# Patient Record
Sex: Female | Born: 2000 | Race: White | Hispanic: No | Marital: Single | Smoking: Never smoker
Health system: Southern US, Community
[De-identification: ages and names within clinical notes are randomized; demographics above are authoritative.]

## PROBLEM LIST (undated history)

## (undated) DIAGNOSIS — F32A Depression, unspecified: Secondary | ICD-10-CM

## (undated) DIAGNOSIS — J45909 Unspecified asthma, uncomplicated: Secondary | ICD-10-CM

## (undated) DIAGNOSIS — F419 Anxiety disorder, unspecified: Secondary | ICD-10-CM

## (undated) HISTORY — DX: Anxiety disorder, unspecified: F41.9

## (undated) HISTORY — DX: Depression, unspecified: F32.A

## (undated) HISTORY — DX: Unspecified asthma, uncomplicated: J45.909

---
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
## 2021-11-06 ENCOUNTER — Encounter: Payer: Self-pay | Admitting: Nurse Practitioner

## 2021-11-06 ENCOUNTER — Other Ambulatory Visit: Payer: Self-pay

## 2021-11-06 ENCOUNTER — Ambulatory Visit (INDEPENDENT_AMBULATORY_CARE_PROVIDER_SITE_OTHER): Payer: Medicaid Other | Admitting: Nurse Practitioner

## 2021-11-06 VITALS — BP 113/73 | HR 88 | Temp 98.6°F | Ht 66.0 in | Wt 162.0 lb

## 2021-11-06 DIAGNOSIS — Z0000 Encounter for general adult medical examination without abnormal findings: Secondary | ICD-10-CM | POA: Diagnosis not present

## 2021-11-06 DIAGNOSIS — R8290 Unspecified abnormal findings in urine: Secondary | ICD-10-CM

## 2021-11-06 LAB — POCT URINALYSIS DIP (CLINITEK)
Bilirubin, UA: NEGATIVE
Blood, UA: NEGATIVE
Glucose, UA: NEGATIVE mg/dL
Ketones, POC UA: NEGATIVE mg/dL
Nitrite, UA: NEGATIVE
POC PROTEIN,UA: NEGATIVE
Spec Grav, UA: >=1.03 — AB (ref 1.010–1.025)
Urobilinogen, UA: 0.2 E.U./dL
pH, UA: 5.5 (ref 5.0–8.0)

## 2021-11-06 NOTE — Progress Notes (Signed)
? ?[facility] ?[place], [place]  [address] ?Phone:  [phone]   Fax:  [phone] ?Subjective:  ? Patient ID: Cindy Christensen, female    DOB: [birth year], 21 y.o.   MRN: [id number] ? ?Chief Complaint  ?Patient presents with  ? Establish Care  ?  Pt is here to establish care  ? ?HPI ?Cindy Christensen 21 y.o. female  has a past medical history of Anxiety, Asthma, and Depression. To the [facility] to establish care. Patient last visit with PCP 1 yr ago, changed to current office due to location and distance from home. ? ?Denies any concerns today. Currently has a balanced diet, but has decreased appetite related to Cymbalta. Does not exercise regularly. Works and goes to school full time. Getting a degree in education and works as an Web designer. Has anxiety and depression, which is being managed by a psychiatrist. Has had one partner in the past 6 mths, unprotected. Heterosexual preference. Currently on birth control patch. ? ?Denies any fever. Denies any fatigue, chest pain, shortness of breath, HA or dizziness. Denies any blurred vision, numbness or tingling. ? ?Past Medical History:  ?Diagnosis Date  ? Anxiety   ? Asthma   ? Depression   ? ? ?History reviewed. No pertinent surgical history. ? ?Family History  ?Problem Relation Age of Onset  ? Depression Mother   ? Fibromyalgia Mother   ? Bipolar disorder Mother   ? Crohn's disease Father   ? Hearing loss Father   ? Diabetes Paternal Grandfather   ? ? ?Social History  ? ?Socioeconomic History  ? Marital status: Single  ?  Spouse name: Not on file  ? Number of children: Not on file  ? Years of education: Not on file  ? Highest education level: Not on file  ?Occupational History  ? Not on file  ?Tobacco Use  ? Smoking status: Never  ? Smokeless tobacco: Never  ?Vaping Use  ? Vaping Use: Never used  ?Substance and Sexual Activity  ? Alcohol use: Not on file  ? Drug use: Not on file  ? Sexual activity: Yes  ?Other Topics Concern  ?  Not on file  ?Social History Narrative  ? Social drinker  ? ?Social Determinants of Health  ? ?Financial Resource Strain: Not on file  ?Food Insecurity: Not on file  ?Transportation Needs: Not on file  ?Physical Activity: Not on file  ?Stress: Not on file  ?Social Connections: Not on file  ?Intimate Partner Violence: Not on file  ? ? ?Outpatient Medications Prior to Visit  ?Medication Sig Dispense Refill  ? albuterol (VENTOLIN HFA) 108 (90 Base) MCG/ACT inhaler Inhale into the lungs.    ? DULoxetine (CYMBALTA) 30 MG capsule Take by mouth.    ? norelgestromin-ethinyl estradiol Marilu Favre) 150-35 MCG/24HR transdermal patch Place 1 patch onto the skin once a week.    ? tiZANidine (ZANAFLEX) 2 MG tablet 1/2 tablet as needed    ? hydrOXYzine (ATARAX) 10 MG tablet Take by mouth.    ? meclizine (ANTIVERT) 25 MG tablet Take by mouth.    ? propranolol (INDERAL) 10 MG tablet Take 10 mg by mouth 2 (two) times daily.    ? ?No facility-administered medications prior to visit.  ? ? ?Not on File ? ?Review of Systems  ?Constitutional:  Negative for chills, fever and malaise/fatigue.  ?HENT: Negative.    ?Eyes: Negative.   ?Respiratory:  Negative for cough and shortness of breath.   ?Cardiovascular:  Negative for chest pain, palpitations and leg swelling.  ?Gastrointestinal:  Negative for abdominal pain, blood in stool, constipation, diarrhea, nausea and vomiting.  ?Genitourinary: Negative.   ?Musculoskeletal: Negative.   ?Skin: Negative.   ?Neurological: Negative.   ?Psychiatric/Behavioral:  Negative for depression. The patient is not nervous/anxious.   ?All other systems reviewed and are negative. ? ?   ?Objective:  ?  ?Physical Exam ?Vitals reviewed.  ?Constitutional:   ?   General: She is not in acute distress. ?   Appearance: Normal appearance.  ?HENT:  ?   Head: Normocephalic.  ?   Right Ear: Tympanic membrane, ear canal and external ear normal. There is no impacted cerumen.  ?   Left Ear: Tympanic membrane, ear canal and  external ear normal. There is no impacted cerumen.  ?   Nose: Nose normal. No congestion or rhinorrhea.  ?   Mouth/Throat:  ?   Mouth: Mucous membranes are moist.  ?   Pharynx: Oropharynx is clear. No oropharyngeal exudate or posterior oropharyngeal erythema.  ?Eyes:  ?   General: No scleral icterus.    ?   Right eye: No discharge.     ?   Left eye: No discharge.  ?   Extraocular Movements: Extraocular movements intact.  ?   Conjunctiva/sclera: Conjunctivae normal.  ?   Pupils: Pupils are equal, round, and reactive to light.  ?Neck:  ?   Vascular: No carotid bruit.  ?Cardiovascular:  ?   Rate and Rhythm: Normal rate and regular rhythm.  ?   Pulses: Normal pulses.  ?   Heart sounds: Normal heart sounds.  ?   Comments: No obvious peripheral edema ?Pulmonary:  ?   Effort: Pulmonary effort is normal.  ?   Breath sounds: Normal breath sounds.  ?Abdominal:  ?   General: Abdomen is flat. Bowel sounds are normal. There is no distension.  ?   Palpations: Abdomen is soft. There is no mass.  ?   Tenderness: There is no abdominal tenderness. There is no right CVA tenderness, left CVA tenderness, guarding or rebound.  ?   Hernia: No hernia is present.  ?Musculoskeletal:     ?   General: No swelling, tenderness, deformity or signs of injury. Normal range of motion.  ?   Cervical back: Normal range of motion and neck supple. No rigidity or tenderness.  ?   Right lower leg: No edema.  ?   Left lower leg: No edema.  ?Lymphadenopathy:  ?   Cervical: No cervical adenopathy.  ?Skin: ?   General: Skin is warm and dry.  ?   Capillary Refill: Capillary refill takes less than 2 seconds.  ?Neurological:  ?   General: No focal deficit present.  ?   Mental Status: She is alert and oriented to person, place, and time.  ?   Cranial Nerves: No cranial nerve deficit.  ?   Sensory: No sensory deficit.  ?   Motor: No weakness.  ?   Coordination: Coordination normal.  ?   Gait: Gait normal.  ?   Deep Tendon Reflexes: Reflexes normal.  ?Psychiatric:      ?   Mood and Affect: Mood normal.     ?   Behavior: Behavior normal.     ?   Thought Content: Thought content normal.     ?   Judgment: Judgment normal.  ? ? ?BP 113/73 (BP Location: Right Arm, Patient Position: Sitting, Cuff Size: Normal)   Pulse 88  Temp 98.6 ?F (37 ?C)   Ht 5' 6"  (1.676 m)   Wt 162 lb (73.5 kg)   LMP 10/19/2021 (Approximate)   SpO2 99%   BMI 26.15 kg/m?  ?Wt Readings from Last 3 Encounters:  ?11/06/21 162 lb (73.5 kg)  ? ? ? ?There is no immunization history on file for this patient. ? ?Diabetic Foot Exam - Simple   ?No data filed ?  ? ? ?No results found for: TSH ?Lab Results  ?Component Value Date  ? WBC 9.5 11/06/2021  ? HGB 13.4 11/06/2021  ? HCT 40.7 11/06/2021  ? MCV 85 11/06/2021  ? PLT 318 11/06/2021  ? ?Lab Results  ?Component Value Date  ? NA 139 11/06/2021  ? K 4.6 11/06/2021  ? CO2 21 11/06/2021  ? GLUCOSE 79 11/06/2021  ? BUN 9 11/06/2021  ? CREATININE 0.74 11/06/2021  ? BILITOT 0.2 11/06/2021  ? ALKPHOS 82 11/06/2021  ? AST 10 11/06/2021  ? ALT 6 11/06/2021  ? PROT 7.0 11/06/2021  ? ALBUMIN 4.0 11/06/2021  ? CALCIUM 9.4 11/06/2021  ? EGFR 118 11/06/2021  ? ?Lab Results  ?Component Value Date  ? CHOL 148 11/06/2021  ? ?Lab Results  ?Component Value Date  ? HDL 54 11/06/2021  ? ?Lab Results  ?Component Value Date  ? [place] 82 11/06/2021  ? ?Lab Results  ?Component Value Date  ? TRIG 60 11/06/2021  ? ?Lab Results  ?Component Value Date  ? CHOLHDL 2.7 11/06/2021  ? ?No results found for: HGBA1C ? ?   ?Assessment & Plan:  ? ?Problem List Items Addressed This Visit   ?None ?Visit Diagnoses   ? ? Health care maintenance    -  Primary  ? Relevant Orders  ? POCT URINALYSIS DIP (CLINITEK) (Completed)  ? CBC with Differential/Platelet (Completed)  ? Comprehensive metabolic panel (Completed)  ? Lipid panel (Completed) ?Encouraged continued diet and exercise efforts  ?Encouraged continued compliance with medication  ?  ? Well adult exam      ? Relevant Orders  ? CBC with  Differential/Platelet (Completed)  ? Comprehensive metabolic panel (Completed)  ? Lipid panel (Completed)  ? Abnormal urinalysis      ? Relevant Orders  ? Urine Culture  ? ?  ? ? ?I am having Sharrie Self maintain her DULoxeti

## 2021-11-06 NOTE — Patient Instructions (Signed)
You were seen today in the [facility] for wellness visit. Labs were collected, results will be available via MyChart or, if abnormal, you will be contacted by clinic staff. You were prescribed medications, please take as directed. Please follow up in 1 yr  for wellness visit 

## 2021-11-07 LAB — CBC WITH DIFFERENTIAL/PLATELET
Basophils Absolute: 0.1 10*3/uL (ref 0.0–0.2)
Basos: 1 %
EOS (ABSOLUTE): 0.1 10*3/uL (ref 0.0–0.4)
Eos: 1 %
Hematocrit: 40.7 % (ref 34.0–46.6)
Hemoglobin: 13.4 g/dL (ref 11.1–15.9)
Immature Grans (Abs): 0 10*3/uL (ref 0.0–0.1)
Immature Granulocytes: 0 %
Lymphocytes Absolute: 1.8 10*3/uL (ref 0.7–3.1)
Lymphs: 19 %
MCH: 28 pg (ref 26.6–33.0)
MCHC: 32.9 g/dL (ref 31.5–35.7)
MCV: 85 fL (ref 79–97)
Monocytes Absolute: 0.4 10*3/uL (ref 0.1–0.9)
Monocytes: 4 %
Neutrophils Absolute: 7.1 10*3/uL — ABNORMAL HIGH (ref 1.4–7.0)
Neutrophils: 75 %
Platelets: 318 10*3/uL (ref 150–450)
RBC: 4.78 x10E6/uL (ref 3.77–5.28)
RDW: 12.2 % (ref 11.7–15.4)
WBC: 9.5 10*3/uL (ref 3.4–10.8)

## 2021-11-07 LAB — COMPREHENSIVE METABOLIC PANEL
ALT: 6 IU/L (ref 0–32)
AST: 10 IU/L (ref 0–40)
Albumin/Globulin Ratio: 1.3 (ref 1.2–2.2)
Albumin: 4 g/dL (ref 3.9–5.0)
Alkaline Phosphatase: 82 IU/L (ref 44–121)
BUN/Creatinine Ratio: 12 (ref 9–23)
BUN: 9 mg/dL (ref 6–20)
Bilirubin Total: 0.2 mg/dL (ref 0.0–1.2)
CO2: 21 mmol/L (ref 20–29)
Calcium: 9.4 mg/dL (ref 8.7–10.2)
Chloride: 104 mmol/L (ref 96–106)
Creatinine, Ser: 0.74 mg/dL (ref 0.57–1.00)
Globulin, Total: 3 g/dL (ref 1.5–4.5)
Glucose: 79 mg/dL (ref 70–99)
Potassium: 4.6 mmol/L (ref 3.5–5.2)
Sodium: 139 mmol/L (ref 134–144)
Total Protein: 7 g/dL (ref 6.0–8.5)
eGFR: 118 mL/min/{1.73_m2} (ref >59)

## 2021-11-07 LAB — LIPID PANEL
Chol/HDL Ratio: 2.7 ratio (ref 0.0–4.4)
Cholesterol, Total: 148 mg/dL (ref 100–199)
HDL: 54 mg/dL (ref >39)
LDL Chol Calc (NIH): 82 mg/dL (ref 0–99)
Triglycerides: 60 mg/dL (ref 0–149)
VLDL Cholesterol Cal: 12 mg/dL (ref 5–40)

## 2021-11-09 LAB — URINE CULTURE

## 2022-05-28 ENCOUNTER — Encounter: Payer: Self-pay | Admitting: Nurse Practitioner

## 2022-05-28 ENCOUNTER — Ambulatory Visit (INDEPENDENT_AMBULATORY_CARE_PROVIDER_SITE_OTHER): Payer: Medicaid Other | Admitting: Nurse Practitioner

## 2022-05-28 VITALS — BP 119/90 | HR 83 | Ht 66.0 in | Wt 159.0 lb

## 2022-05-28 DIAGNOSIS — Z789 Other specified health status: Secondary | ICD-10-CM | POA: Diagnosis not present

## 2022-05-28 DIAGNOSIS — Z3009 Encounter for other general counseling and advice on contraception: Secondary | ICD-10-CM

## 2022-05-28 LAB — POCT URINE PREGNANCY: Preg Test, Ur: NEGATIVE

## 2022-05-28 MED ORDER — XULANE 150-35 MCG/24HR TD PTWK: 1.0000 | MEDICATED_PATCH | TRANSDERMAL | 3 refills | Status: DC

## 2022-05-28 MED ORDER — TIZANIDINE HCL 2 MG PO TABS: ORAL_TABLET | ORAL | 3 refills | Status: AC

## 2022-05-28 NOTE — Progress Notes (Signed)
 @  Patient ID: Cindy Christensen, female    DOB: 29-Nov-2000, 21 y.o.   MRN: [id number]  Chief Complaint  Patient presents with   Medication Refill    Referring provider: Teena Dunk, NP   HPI  Cindy Christensen 21 y.o. female  has a past medical history of Anxiety, Asthma, and Depression.   Patient presents today for follow-up visit.  She is needing birth control refill.  She states that she ran out of this medication about a month ago.  We will check pregnancy test in office today. Denies f/c/s, n/v/d, hemoptysis, PND, leg swelling Denies chest pain or edema.      Not on File   There is no immunization history on file for this patient.  Past Medical History:  Diagnosis Date   Anxiety    Asthma    Depression     Tobacco History: Social History   Tobacco Use  Smoking Status Never  Smokeless Tobacco Never   Counseling given: Not Answered   Outpatient Encounter Medications as of 05/28/2022  Medication Sig   albuterol (VENTOLIN HFA) 108 (90 Base) MCG/ACT inhaler Inhale into the lungs.   DULoxetine (CYMBALTA) 30 MG capsule Take by mouth.   hydrOXYzine (ATARAX) 10 MG tablet Take by mouth.   meclizine (ANTIVERT) 25 MG tablet Take by mouth.   propranolol (INDERAL) 10 MG tablet Take 10 mg by mouth 2 (two) times daily.   [DISCONTINUED] norelgestromin-ethinyl estradiol Cindy Christensen) 150-35 MCG/24HR transdermal patch Place 1 patch onto the skin once a week.   [DISCONTINUED] tiZANidine (ZANAFLEX) 2 MG tablet 1/2 tablet as needed   norelgestromin-ethinyl estradiol Cindy Christensen) 150-35 MCG/24HR transdermal patch Place 1 patch onto the skin once a week.   tiZANidine (ZANAFLEX) 2 MG tablet 1/2 tablet as needed   No facility-administered encounter medications on file as of 05/28/2022.     Review of Systems  Review of Systems  Constitutional: Negative.   HENT: Negative.    Cardiovascular: Negative.   Gastrointestinal: Negative.   Allergic/Immunologic: Negative.   Neurological:  Negative.   Psychiatric/Behavioral: Negative.         Physical Exam  BP (!) 119/90   Pulse 83   Ht 5\' 6"  (1.676 m)   Wt 159 lb (72.1 kg)   SpO2 94%   BMI 25.66 kg/m   Wt Readings from Last 5 Encounters:  05/28/22 159 lb (72.1 kg)  11/06/21 162 lb (73.5 kg)     Physical Exam Vitals and nursing note reviewed.  Constitutional:      General: She is not in acute distress.    Appearance: She is well-developed.  Cardiovascular:     Rate and Rhythm: Normal rate and regular rhythm.  Pulmonary:     Effort: Pulmonary effort is normal.     Breath sounds: Normal breath sounds.  Neurological:     Mental Status: She is alert and oriented to person, place, and time.      Assessment & Plan:   Birth control counseling - POCT urine pregnancy  2. Birth control counseling  - norelgestromin-ethinyl estradiol Cindy Christensen) 150-35 MCG/24HR transdermal patch; Place 1 patch onto the skin once a week.  Dispense: 3 patch; Refill: 3  Follow up:  Follow up in 1 year     Cindy Foy, NP 05/28/2022

## 2022-05-28 NOTE — Assessment & Plan Note (Signed)
-   POCT urine pregnancy  2. Birth control counseling  - norelgestromin-ethinyl estradiol Marilu Favre) 150-35 MCG/24HR transdermal patch; Place 1 patch onto the skin once a week.  Dispense: 3 patch; Refill: 3  Follow up:  Follow up in 1 year

## 2022-05-28 NOTE — Patient Instructions (Addendum)
1. Uses birth control  - POCT urine pregnancy  2. Birth control counseling  - norelgestromin-ethinyl estradiol Cindy Christensen) 150-35 MCG/24HR transdermal patch; Place 1 patch onto the skin once a week.  Dispense: 3 patch; Refill: 3  Follow up:  Follow up in 1 year  Ethinyl Estradiol; Norelgestromin Patches What is this medication? ETHINYL ESTRADIOL;NORELGESTROMIN (ETH in il es tra DYE ole; nor el JES troe min) prevents ovulation and pregnancy. It belongs to a group of medications called contraceptives. It is a combination of the hormones estrogen and progestin. This medicine may be used for other purposes; ask your health care provider or pharmacist if you have questions. COMMON BRAND NAME(S): Ortho Becky Sax What should I tell my care team before I take this medication? They need to know if you have or ever had any of these conditions: Abnormal vaginal bleeding Blood vessel disease or blood clots Breast, cervical, endometrial, ovarian, liver, or uterine cancer Diabetes (high blood sugar) Gallbladder disease Having surgery Heart disease or recent heart attack High blood pressure High cholesterol or triglycerides History of irregular heartbeat or heart valve problems Kidney disease Liver disease Migraine headaches Protein C/S deficiency Recently had a baby, miscarriage, or abortion Stroke Systemic lupus erythematosus (SLE) Tobacco smoker An unusual or allergic reaction to estrogens, progestins, other medications, foods, dyes, or preservatives Pregnant or trying to get pregnant Breast-feeding How should I use this medication? This patch is applied to the skin. Follow the directions on the prescription label. Apply to clean, dry, healthy skin on the buttock, abdomen, upper outer arm or upper torso, in a place where it will not be rubbed by tight clothing. Do not use lotions or other cosmetics on the site where the patch will go. Press the patch firmly in place for 10 seconds to  ensure good contact with the skin. Change the patch every 7 days on the same day of the week for 3 weeks. You will then have a break from the patch for 1 week, after which you will apply a new patch. Do not use your medication more often than directed. Contact your care team about the use of this medication in children. Special care may be needed. This medication has been used in female children who have started having menstrual periods. A patient package insert for the product will be given with each prescription and refill. Read this sheet carefully each time. The sheet may change frequently. Overdosage: If you think you have taken too much of this medicine contact a poison control center or emergency room at once. NOTE: This medicine is only for you. Do not share this medicine with others. What if I miss a dose? You will need to replace your patch once a week as directed. If your patch is lost or falls off, contact your care team for advice. You may need to use another form of birth control if your patch has been off for more than 1 day. What may interact with this medication? Do not take this medication with the following: Dasabuvir; ombitasvir; paritaprevir; ritonavir Ombitasvir; paritaprevir; ritonavir This medication may also interact with the following: Acetaminophen Antibiotics or medications for infections, especially rifampin, rifabutin, rifapentine, and possibly penicillins or tetracyclines Aprepitant or fosaprepitant Armodafinil Ascorbic acid (vitamin C) Barbiturate medications, such as phenobarbital or primidone Bosentan Certain antiviral medications for hepatitis, HIV or AIDS Certain medications for cancer treatment Certain medications for seizures like carbamazepine, clobazam, felbamate, lamotrigine, oxcarbazepine, phenytoin, rufinamide, topiramate Certain medications for treating high cholesterol Cyclosporine Dantrolene Elagolix  Flibanserin Grapefruit  juice Lesinurad Medications for diabetes Medications to treat fungal infections, such as griseofulvin, miconazole, fluconazole, ketoconazole, itraconazole, posaconazole or voriconazole Mifepristone Mitotane Modafinil Morphine Mycophenolate St. John's wort Tamoxifen Temazepam Theophylline or aminophylline Thyroid hormones Tizanidine Tranexamic acid Ulipristal Warfarin This list may not describe all possible interactions. Give your health care provider a list of all the medicines, herbs, non-prescription drugs, or dietary supplements you use. Also tell them if you smoke, drink alcohol, or use illegal drugs. Some items may interact with your medicine. What should I watch for while using this medication? Visit your care team for regular checks on your progress. You will need a regular breast and pelvic exam and Pap smear while on this medication. Use an additional method of contraception during the first cycle that you use this patch. If you have any reason to think you are pregnant, stop using this medication right away and contact your care team. If you are using this medication for hormone related problems, it may take several cycles of use to see improvement in your condition. Smoking increases the risk of getting a blood clot or having a stroke while you are using hormonal birth control, especially if you are older than 21 years old. You are strongly advised not to smoke. This medication can make your body retain fluid, making your fingers, hands, or ankles swell. Your blood pressure can go up. Contact your care team if you feel you are retaining fluid. This medication can make you more sensitive to the sun. Keep out of the sun. If you cannot avoid being in the sun, wear protective clothing and use sunscreen. Do not use sun lamps or tanning beds/booths. If you wear contact lenses and notice visual changes, or if the lenses begin to feel uncomfortable, consult your eye care specialist. In  some women, tenderness, swelling, or minor bleeding of the gums may occur. Notify your dentist if this happens. Brushing and flossing your teeth regularly may help limit this. See your dentist regularly and inform your dentist of the medications you are taking. If you are going to have elective surgery or an MRI, you may need to stop using this medication before the surgery or MRI. Consult your care team for advice. This medication does not protect you against HIV infection (AIDS) or any other sexually transmitted infections. What side effects may I notice from receiving this medication? Side effects that you should report to your care team as soon as possible: Allergic reactions--skin rash, itching, hives, swelling of the face, lips, tongue, or throat Blood clot--pain, swelling, or warmth in the leg, shortness of breath, chest pain Gallbladder problems--severe stomach pain, nausea, vomiting, fever Increase in blood pressure Liver injury--right upper belly pain, loss of appetite, nausea, light-colored stool, dark yellow or brown urine, yellowing skin or eyes, unusual weakness or fatigue New or worsening migraines or headaches Stroke--sudden numbness or weakness of the face, arm, or leg, trouble speaking, confusion, trouble walking, loss of balance or coordination, dizziness, severe headache, change in vision Unusual vaginal discharge, itching, or odor Worsening mood, feelings of depression Side effects that usually do not require medical attention (report to your care team if they continue or are bothersome): Breast pain or tenderness Dark patches of skin on the face or other sun-exposed areas Irregular menstrual cycles or spotting Nausea Weight gain This list may not describe all possible side effects. Call your doctor for medical advice about side effects. You may report side effects to FDA at 1-800-FDA-1088. Where should  I keep my medication? Keep out of the reach of children and  pets. Store at room temperature between 15 and 30 degrees C (59 and 86 degrees F). Keep the patch in its pouch until time of use. Throw away any unused medication after the expiration date. Dispose of used patches properly. Since a used patch may still contain active hormones, fold the patch in half so that it sticks to itself prior to disposal. Throw away in a place where children or pets cannot reach. NOTE: This sheet is a summary. It may not cover all possible information. If you have questions about this medicine, talk to your doctor, pharmacist, or health care provider.  2023 Elsevier/Gold Standard (2020-09-30 00:00:00)

## 2022-10-29 ENCOUNTER — Encounter: Payer: Self-pay | Admitting: Nurse Practitioner

## 2022-11-07 ENCOUNTER — Ambulatory Visit: Payer: Medicaid Other | Admitting: Nurse Practitioner

## 2022-11-13 ENCOUNTER — Other Ambulatory Visit: Payer: Self-pay

## 2022-11-13 NOTE — Telephone Encounter (Signed)
Error

## 2022-11-17 ENCOUNTER — Telehealth: Payer: Self-pay | Admitting: Nurse Practitioner

## 2022-11-17 NOTE — Telephone Encounter (Signed)
Caller & Relationship to patient:  MRN #  [id number]   Call Back Number:   Date of Last Office Visit: 11/13/2022     Date of Next Office Visit: Visit date not found    Medication(s) to be Refilled: [facility] REFILL pt [facility]   Preferred Pharmacy:   ** Please notify patient to allow 48-72 hours to process** **Let patient know to contact pharmacy at the end of the day to make sure medication is ready. ** **If patient has not been seen in a year or longer, book an appointment **Advise to use MyChart for refill requests OR to contact their pharmacy

## 2022-11-19 ENCOUNTER — Other Ambulatory Visit: Payer: Self-pay | Admitting: Nurse Practitioner

## 2022-11-19 DIAGNOSIS — Z3009 Encounter for other general counseling and advice on contraception: Secondary | ICD-10-CM

## 2022-11-19 MED ORDER — NORELGESTROMIN-ETH ESTRADIOL 150-35 MCG/24HR TD PTWK: 1.0000 | MEDICATED_PATCH | TRANSDERMAL | 3 refills | Status: DC

## 2022-11-20 ENCOUNTER — Telehealth: Payer: Self-pay | Admitting: Nurse Practitioner

## 2022-11-20 NOTE — Telephone Encounter (Signed)
Caller & Relationship to patient:  MRN #  [id number]   Call Back Number:   Date of Last Office Visit: 11/17/2022     Date of Next Office Visit: Visit date not found    Medication(s) to be Refilled: BC to a different pharmacy  [facility] on [address]   Preferred Pharmacy:   ** Please notify patient to allow 48-72 hours to process** **Let patient know to contact pharmacy at the end of the day to make sure medication is ready. ** **If patient has not been seen in a year or longer, book an appointment **Advise to use MyChart for refill requests OR to contact their pharmacy

## 2023-02-16 ENCOUNTER — Other Ambulatory Visit: Payer: Self-pay | Admitting: Nurse Practitioner

## 2023-02-16 ENCOUNTER — Telehealth: Payer: Self-pay

## 2023-02-16 DIAGNOSIS — Z3009 Encounter for other general counseling and advice on contraception: Secondary | ICD-10-CM

## 2023-02-16 MED ORDER — NORELGESTROMIN-ETH ESTRADIOL 150-35 MCG/24HR TD PTWK: 1.0000 | MEDICATED_PATCH | TRANSDERMAL | 3 refills | Status: AC

## 2023-02-16 NOTE — Telephone Encounter (Signed)
Medication refill it by provider.

## 2023-05-28 ENCOUNTER — Ambulatory Visit: Payer: Self-pay | Admitting: Nurse Practitioner
